# Patient Record
Sex: Male | Born: 1965 | Race: White | Hispanic: No | Marital: Single | Smoking: Current some day smoker
Health system: Southern US, Community
[De-identification: ages and names within clinical notes are randomized; demographics above are authoritative.]

## PROBLEM LIST (undated history)

## (undated) DIAGNOSIS — Z9889 Other specified postprocedural states: Secondary | ICD-10-CM

## (undated) DIAGNOSIS — E119 Type 2 diabetes mellitus without complications: Secondary | ICD-10-CM

## (undated) DIAGNOSIS — G8929 Other chronic pain: Secondary | ICD-10-CM

## (undated) DIAGNOSIS — M79671 Pain in right foot: Secondary | ICD-10-CM

## (undated) HISTORY — DX: Pain in right foot: M79.671

## (undated) HISTORY — PX: LEG SURGERY: SHX1003

## (undated) HISTORY — DX: Other chronic pain: G89.29

## (undated) HISTORY — DX: Type 2 diabetes mellitus without complications: E11.9

## (undated) HISTORY — PX: ORTHOPEDIC SURGERY: SHX850

---

## 2004-03-25 ENCOUNTER — Emergency Department (HOSPITAL_COMMUNITY): Admission: EM | Admit: 2004-03-25 | Discharge: 2004-03-25 | Payer: Self-pay | Admitting: Emergency Medicine

## 2009-12-14 ENCOUNTER — Emergency Department (HOSPITAL_COMMUNITY): Admission: EM | Admit: 2009-12-14 | Discharge: 2009-12-14 | Payer: Self-pay | Admitting: Emergency Medicine

## 2010-01-04 ENCOUNTER — Emergency Department (HOSPITAL_COMMUNITY): Admission: EM | Admit: 2010-01-04 | Discharge: 2010-01-04 | Payer: Self-pay | Admitting: Emergency Medicine

## 2010-01-23 ENCOUNTER — Emergency Department (HOSPITAL_COMMUNITY): Admission: EM | Admit: 2010-01-23 | Discharge: 2010-01-23 | Payer: Self-pay | Admitting: Emergency Medicine

## 2010-02-15 ENCOUNTER — Observation Stay (HOSPITAL_COMMUNITY): Admission: EM | Admit: 2010-02-15 | Discharge: 2010-02-15 | Payer: Self-pay | Admitting: Emergency Medicine

## 2010-03-08 ENCOUNTER — Emergency Department (HOSPITAL_COMMUNITY): Admission: EM | Admit: 2010-03-08 | Discharge: 2010-03-08 | Payer: Self-pay | Admitting: Emergency Medicine

## 2010-03-17 ENCOUNTER — Ambulatory Visit: Payer: Self-pay | Admitting: Cardiology

## 2010-03-17 ENCOUNTER — Inpatient Hospital Stay (HOSPITAL_COMMUNITY): Admission: AC | Admit: 2010-03-17 | Discharge: 2010-04-02 | Payer: Self-pay

## 2010-03-31 ENCOUNTER — Encounter (INDEPENDENT_AMBULATORY_CARE_PROVIDER_SITE_OTHER): Payer: Self-pay | Admitting: Orthopedic Surgery

## 2010-05-12 ENCOUNTER — Emergency Department (HOSPITAL_COMMUNITY): Admission: EM | Admit: 2010-05-12 | Discharge: 2010-05-12 | Payer: Self-pay | Admitting: Emergency Medicine

## 2010-05-14 ENCOUNTER — Emergency Department (HOSPITAL_COMMUNITY): Admission: EM | Admit: 2010-05-14 | Discharge: 2010-05-14 | Payer: Self-pay | Admitting: Emergency Medicine

## 2010-05-26 ENCOUNTER — Emergency Department (HOSPITAL_COMMUNITY)
Admission: EM | Admit: 2010-05-26 | Discharge: 2010-05-26 | Payer: Self-pay | Source: Home / Self Care | Admitting: Emergency Medicine

## 2010-05-28 ENCOUNTER — Encounter (INDEPENDENT_AMBULATORY_CARE_PROVIDER_SITE_OTHER): Payer: Self-pay | Admitting: Internal Medicine

## 2010-05-28 ENCOUNTER — Ambulatory Visit: Payer: Self-pay | Admitting: Family Medicine

## 2010-05-28 LAB — CONVERTED CEMR LAB
BUN: 10 mg/dL (ref 6–23)
Chloride: 98 meq/L (ref 96–112)
Glucose, Bld: 65 mg/dL — ABNORMAL LOW (ref 70–99)

## 2010-06-03 ENCOUNTER — Emergency Department (HOSPITAL_COMMUNITY): Admission: EM | Admit: 2010-06-03 | Discharge: 2010-06-03 | Payer: Self-pay | Admitting: Emergency Medicine

## 2010-06-10 ENCOUNTER — Emergency Department (HOSPITAL_COMMUNITY)
Admission: EM | Admit: 2010-06-10 | Discharge: 2010-06-11 | Payer: Self-pay | Source: Home / Self Care | Admitting: Emergency Medicine

## 2010-06-11 ENCOUNTER — Ambulatory Visit: Payer: Self-pay | Admitting: Internal Medicine

## 2010-06-22 ENCOUNTER — Encounter: Admission: RE | Admit: 2010-06-22 | Discharge: 2010-07-12 | Payer: Self-pay | Admitting: Orthopedic Surgery

## 2010-07-01 ENCOUNTER — Emergency Department (HOSPITAL_COMMUNITY)
Admission: EM | Admit: 2010-07-01 | Discharge: 2010-07-01 | Payer: Self-pay | Source: Home / Self Care | Admitting: Emergency Medicine

## 2011-01-02 LAB — GLUCOSE, CAPILLARY
Glucose-Capillary: 287 mg/dL — ABNORMAL HIGH (ref 70–99)
Glucose-Capillary: 400 mg/dL — ABNORMAL HIGH (ref 70–99)
Glucose-Capillary: 66 mg/dL — ABNORMAL LOW (ref 70–99)
Glucose-Capillary: 75 mg/dL (ref 70–99)
Glucose-Capillary: 83 mg/dL (ref 70–99)

## 2011-01-02 LAB — BASIC METABOLIC PANEL
BUN: 11 mg/dL (ref 6–23)
Calcium: 7.7 mg/dL — ABNORMAL LOW (ref 8.4–10.5)
Creatinine, Ser: 1.03 mg/dL (ref 0.4–1.5)
Creatinine, Ser: 1.45 mg/dL (ref 0.4–1.5)
GFR calc Af Amer: >60 mL/min (ref >60)
GFR calc Af Amer: >60 mL/min (ref >60)
GFR calc non Af Amer: 53 mL/min — ABNORMAL LOW (ref >60)
GFR calc non Af Amer: >60 mL/min (ref >60)
GFR calc non Af Amer: >60 mL/min (ref >60)
Glucose, Bld: 379 mg/dL — ABNORMAL HIGH (ref 70–99)
Potassium: 3.7 mEq/L (ref 3.5–5.1)
Potassium: 4.9 mEq/L (ref 3.5–5.1)
Sodium: 131 mEq/L — ABNORMAL LOW (ref 135–145)

## 2011-01-02 LAB — CBC
HCT: 23.3 % — ABNORMAL LOW (ref 39.0–52.0)
HCT: 24.1 % — ABNORMAL LOW (ref 39.0–52.0)
Hemoglobin: 7.9 g/dL — ABNORMAL LOW (ref 13.0–17.0)
MCHC: 34 g/dL (ref 30.0–36.0)
MCV: 92.2 fL (ref 78.0–100.0)
MCV: 93.2 fL (ref 78.0–100.0)
Platelets: 531 10*3/uL — ABNORMAL HIGH (ref 150–400)
RBC: 2.35 MIL/uL — ABNORMAL LOW (ref 4.22–5.81)
RBC: 2.59 MIL/uL — ABNORMAL LOW (ref 4.22–5.81)
RDW: 15.5 % (ref 11.5–15.5)
WBC: 16 10*3/uL — ABNORMAL HIGH (ref 4.0–10.5)
WBC: 9.4 10*3/uL (ref 4.0–10.5)

## 2011-01-02 LAB — PROTIME-INR
INR: 1.32 (ref 0.00–1.49)
INR: 1.34 (ref 0.00–1.49)
INR: 1.36 (ref 0.00–1.49)
Prothrombin Time: 16.3 seconds — ABNORMAL HIGH (ref 11.6–15.2)
Prothrombin Time: 16.5 seconds — ABNORMAL HIGH (ref 11.6–15.2)
Prothrombin Time: 16.7 seconds — ABNORMAL HIGH (ref 11.6–15.2)

## 2011-01-02 LAB — TYPE AND SCREEN: ABO/RH(D): O POS

## 2011-01-03 LAB — GLUCOSE, CAPILLARY
Glucose-Capillary: 119 mg/dL — ABNORMAL HIGH (ref 70–99)
Glucose-Capillary: 128 mg/dL — ABNORMAL HIGH (ref 70–99)
Glucose-Capillary: 129 mg/dL — ABNORMAL HIGH (ref 70–99)
Glucose-Capillary: 136 mg/dL — ABNORMAL HIGH (ref 70–99)
Glucose-Capillary: 144 mg/dL — ABNORMAL HIGH (ref 70–99)
Glucose-Capillary: 150 mg/dL — ABNORMAL HIGH (ref 70–99)
Glucose-Capillary: 152 mg/dL — ABNORMAL HIGH (ref 70–99)
Glucose-Capillary: 156 mg/dL — ABNORMAL HIGH (ref 70–99)
Glucose-Capillary: 157 mg/dL — ABNORMAL HIGH (ref 70–99)
Glucose-Capillary: 159 mg/dL — ABNORMAL HIGH (ref 70–99)
Glucose-Capillary: 162 mg/dL — ABNORMAL HIGH (ref 70–99)
Glucose-Capillary: 171 mg/dL — ABNORMAL HIGH (ref 70–99)
Glucose-Capillary: 173 mg/dL — ABNORMAL HIGH (ref 70–99)
Glucose-Capillary: 174 mg/dL — ABNORMAL HIGH (ref 70–99)
Glucose-Capillary: 186 mg/dL — ABNORMAL HIGH (ref 70–99)
Glucose-Capillary: 191 mg/dL — ABNORMAL HIGH (ref 70–99)
Glucose-Capillary: 192 mg/dL — ABNORMAL HIGH (ref 70–99)
Glucose-Capillary: 204 mg/dL — ABNORMAL HIGH (ref 70–99)
Glucose-Capillary: 211 mg/dL — ABNORMAL HIGH (ref 70–99)
Glucose-Capillary: 220 mg/dL — ABNORMAL HIGH (ref 70–99)
Glucose-Capillary: 240 mg/dL — ABNORMAL HIGH (ref 70–99)
Glucose-Capillary: 241 mg/dL — ABNORMAL HIGH (ref 70–99)
Glucose-Capillary: 249 mg/dL — ABNORMAL HIGH (ref 70–99)
Glucose-Capillary: 270 mg/dL — ABNORMAL HIGH (ref 70–99)
Glucose-Capillary: 277 mg/dL — ABNORMAL HIGH (ref 70–99)
Glucose-Capillary: 294 mg/dL — ABNORMAL HIGH (ref 70–99)
Glucose-Capillary: 299 mg/dL — ABNORMAL HIGH (ref 70–99)
Glucose-Capillary: 308 mg/dL — ABNORMAL HIGH (ref 70–99)
Glucose-Capillary: 308 mg/dL — ABNORMAL HIGH (ref 70–99)
Glucose-Capillary: 308 mg/dL — ABNORMAL HIGH (ref 70–99)
Glucose-Capillary: 369 mg/dL — ABNORMAL HIGH (ref 70–99)
Glucose-Capillary: 50 mg/dL — ABNORMAL LOW (ref 70–99)
Glucose-Capillary: 67 mg/dL — ABNORMAL LOW (ref 70–99)
Glucose-Capillary: 82 mg/dL (ref 70–99)
Glucose-Capillary: 85 mg/dL (ref 70–99)

## 2011-01-03 LAB — CBC
HCT: 21 % — ABNORMAL LOW (ref 39.0–52.0)
HCT: 21.2 % — ABNORMAL LOW (ref 39.0–52.0)
HCT: 21.4 % — ABNORMAL LOW (ref 39.0–52.0)
HCT: 21.9 % — ABNORMAL LOW (ref 39.0–52.0)
HCT: 22.1 % — ABNORMAL LOW (ref 39.0–52.0)
HCT: 22.2 % — ABNORMAL LOW (ref 39.0–52.0)
HCT: 24.3 % — ABNORMAL LOW (ref 39.0–52.0)
HCT: 25.1 % — ABNORMAL LOW (ref 39.0–52.0)
HCT: 29 % — ABNORMAL LOW (ref 39.0–52.0)
HCT: 30 % — ABNORMAL LOW (ref 39.0–52.0)
HCT: 35.4 % — ABNORMAL LOW (ref 39.0–52.0)
HCT: 48.4 % (ref 39.0–52.0)
Hemoglobin: 10.2 g/dL — ABNORMAL LOW (ref 13.0–17.0)
Hemoglobin: 12.3 g/dL — ABNORMAL LOW (ref 13.0–17.0)
Hemoglobin: 16.8 g/dL (ref 13.0–17.0)
Hemoglobin: 7.2 g/dL — ABNORMAL LOW (ref 13.0–17.0)
Hemoglobin: 7.3 g/dL — ABNORMAL LOW (ref 13.0–17.0)
Hemoglobin: 7.3 g/dL — ABNORMAL LOW (ref 13.0–17.0)
Hemoglobin: 7.4 g/dL — ABNORMAL LOW (ref 13.0–17.0)
Hemoglobin: 7.5 g/dL — ABNORMAL LOW (ref 13.0–17.0)
Hemoglobin: 7.7 g/dL — ABNORMAL LOW (ref 13.0–17.0)
Hemoglobin: 7.8 g/dL — ABNORMAL LOW (ref 13.0–17.0)
Hemoglobin: 8.5 g/dL — ABNORMAL LOW (ref 13.0–17.0)
MCHC: 34.3 g/dL (ref 30.0–36.0)
MCHC: 34.6 g/dL (ref 30.0–36.0)
MCHC: 34.7 g/dL (ref 30.0–36.0)
MCHC: 35.1 g/dL (ref 30.0–36.0)
MCHC: 35.2 g/dL (ref 30.0–36.0)
MCHC: 35.4 g/dL (ref 30.0–36.0)
MCHC: 35.5 g/dL (ref 30.0–36.0)
MCHC: 35.9 g/dL (ref 30.0–36.0)
MCV: 91.9 fL (ref 78.0–100.0)
MCV: 92.3 fL (ref 78.0–100.0)
MCV: 93.2 fL (ref 78.0–100.0)
MCV: 93.4 fL (ref 78.0–100.0)
MCV: 93.5 fL (ref 78.0–100.0)
MCV: 93.6 fL (ref 78.0–100.0)
MCV: 93.7 fL (ref 78.0–100.0)
MCV: 93.7 fL (ref 78.0–100.0)
MCV: 94.5 fL (ref 78.0–100.0)
MCV: 97.2 fL (ref 78.0–100.0)
Platelets: 100 10*3/uL — ABNORMAL LOW (ref 150–400)
Platelets: 129 10*3/uL — ABNORMAL LOW (ref 150–400)
Platelets: 161 10*3/uL (ref 150–400)
Platelets: 229 10*3/uL (ref 150–400)
Platelets: 337 10*3/uL (ref 150–400)
Platelets: 346 10*3/uL (ref 150–400)
Platelets: 556 10*3/uL — ABNORMAL HIGH (ref 150–400)
Platelets: 599 10*3/uL — ABNORMAL HIGH (ref 150–400)
Platelets: 646 10*3/uL — ABNORMAL HIGH (ref 150–400)
Platelets: 697 10*3/uL — ABNORMAL HIGH (ref 150–400)
Platelets: 95 10*3/uL — ABNORMAL LOW (ref 150–400)
RBC: 2.17 MIL/uL — ABNORMAL LOW (ref 4.22–5.81)
RBC: 2.25 MIL/uL — ABNORMAL LOW (ref 4.22–5.81)
RBC: 2.26 MIL/uL — ABNORMAL LOW (ref 4.22–5.81)
RBC: 2.29 MIL/uL — ABNORMAL LOW (ref 4.22–5.81)
RBC: 2.37 MIL/uL — ABNORMAL LOW (ref 4.22–5.81)
RBC: 2.69 MIL/uL — ABNORMAL LOW (ref 4.22–5.81)
RBC: 2.72 MIL/uL — ABNORMAL LOW (ref 4.22–5.81)
RBC: 2.83 MIL/uL — ABNORMAL LOW (ref 4.22–5.81)
RBC: 4.98 MIL/uL (ref 4.22–5.81)
RDW: 14.2 % (ref 11.5–15.5)
RDW: 14.2 % (ref 11.5–15.5)
RDW: 14.2 % (ref 11.5–15.5)
RDW: 14.4 % (ref 11.5–15.5)
RDW: 14.6 % (ref 11.5–15.5)
RDW: 14.7 % (ref 11.5–15.5)
RDW: 14.9 % (ref 11.5–15.5)
RDW: 15.2 % (ref 11.5–15.5)
RDW: 15.2 % (ref 11.5–15.5)
RDW: 15.5 % (ref 11.5–15.5)
RDW: 15.9 % — ABNORMAL HIGH (ref 11.5–15.5)
WBC: 10 10*3/uL (ref 4.0–10.5)
WBC: 10.4 10*3/uL (ref 4.0–10.5)
WBC: 6.6 10*3/uL (ref 4.0–10.5)
WBC: 7.3 10*3/uL (ref 4.0–10.5)
WBC: 8.1 10*3/uL (ref 4.0–10.5)
WBC: 8.7 10*3/uL (ref 4.0–10.5)
WBC: 8.9 10*3/uL (ref 4.0–10.5)

## 2011-01-03 LAB — PROTIME-INR
INR: 1.1 (ref 0.00–1.49)
INR: 1.21 (ref 0.00–1.49)
INR: 1.3 (ref 0.00–1.49)
INR: 1.39 (ref 0.00–1.49)
INR: 1.5 — ABNORMAL HIGH (ref 0.00–1.49)
INR: 1.69 — ABNORMAL HIGH (ref 0.00–1.49)
INR: 1.73 — ABNORMAL HIGH (ref 0.00–1.49)
Prothrombin Time: 15.2 seconds (ref 11.6–15.2)
Prothrombin Time: 16.1 seconds — ABNORMAL HIGH (ref 11.6–15.2)
Prothrombin Time: 16.8 seconds — ABNORMAL HIGH (ref 11.6–15.2)
Prothrombin Time: 16.9 seconds — ABNORMAL HIGH (ref 11.6–15.2)
Prothrombin Time: 18 seconds — ABNORMAL HIGH (ref 11.6–15.2)
Prothrombin Time: 18.1 seconds — ABNORMAL HIGH (ref 11.6–15.2)
Prothrombin Time: 21.8 seconds — ABNORMAL HIGH (ref 11.6–15.2)
Prothrombin Time: 24.7 seconds — ABNORMAL HIGH (ref 11.6–15.2)
Prothrombin Time: 33.7 seconds — ABNORMAL HIGH (ref 11.6–15.2)

## 2011-01-03 LAB — TYPE AND SCREEN

## 2011-01-03 LAB — POCT I-STAT 7, (LYTES, BLD GAS, ICA,H+H)
Acid-base deficit: 4 mmol/L — ABNORMAL HIGH (ref 0.0–2.0)
Acid-base deficit: 5 mmol/L — ABNORMAL HIGH (ref 0.0–2.0)
Acid-base deficit: 6 mmol/L — ABNORMAL HIGH (ref 0.0–2.0)
Bicarbonate: 19.5 meq/L — ABNORMAL LOW (ref 20.0–24.0)
Bicarbonate: 22.3 meq/L (ref 20.0–24.0)
Calcium, Ion: 1.04 mmol/L — ABNORMAL LOW (ref 1.12–1.32)
Calcium, Ion: 1.05 mmol/L — ABNORMAL LOW (ref 1.12–1.32)
HCT: 27 % — ABNORMAL LOW (ref 39.0–52.0)
HCT: 30 % — ABNORMAL LOW (ref 39.0–52.0)
HCT: 39 % (ref 39.0–52.0)
Hemoglobin: 10.2 g/dL — ABNORMAL LOW (ref 13.0–17.0)
Hemoglobin: 13.3 g/dL (ref 13.0–17.0)
Hemoglobin: 9.2 g/dL — ABNORMAL LOW (ref 13.0–17.0)
O2 Saturation: 100 %
Potassium: 3.6 meq/L (ref 3.5–5.1)
Sodium: 143 meq/L (ref 135–145)
TCO2: 21 mmol/L (ref 0–100)
TCO2: 24 mmol/L (ref 0–100)
pCO2 arterial: 49.1 mmHg — ABNORMAL HIGH (ref 35.0–45.0)
pH, Arterial: 7.197 — CL (ref 7.350–7.450)
pH, Arterial: 7.264 — ABNORMAL LOW (ref 7.350–7.450)
pO2, Arterial: 116 mmHg — ABNORMAL HIGH (ref 80.0–100.0)
pO2, Arterial: 147 mmHg — ABNORMAL HIGH (ref 80.0–100.0)
pO2, Arterial: 369 mmHg — ABNORMAL HIGH (ref 80.0–100.0)

## 2011-01-03 LAB — POCT I-STAT GLUCOSE
Glucose, Bld: 70 mg/dL (ref 70–99)
Glucose, Bld: 80 mg/dL (ref 70–99)
Glucose, Bld: 95 mg/dL (ref 70–99)
Operator id: 119881
Operator id: 178832

## 2011-01-03 LAB — BASIC METABOLIC PANEL
BUN: 13 mg/dL (ref 6–23)
BUN: 5 mg/dL — ABNORMAL LOW (ref 6–23)
CO2: 26 mEq/L (ref 19–32)
CO2: 26 mEq/L (ref 19–32)
CO2: 29 mEq/L (ref 19–32)
Calcium: 7.6 mg/dL — ABNORMAL LOW (ref 8.4–10.5)
Calcium: 8.1 mg/dL — ABNORMAL LOW (ref 8.4–10.5)
Calcium: 8.4 mg/dL (ref 8.4–10.5)
Chloride: 103 mEq/L (ref 96–112)
Chloride: 107 mEq/L (ref 96–112)
Chloride: 96 mEq/L (ref 96–112)
Chloride: 97 mEq/L (ref 96–112)
Creatinine, Ser: 1.12 mg/dL (ref 0.4–1.5)
Creatinine, Ser: 1.14 mg/dL (ref 0.4–1.5)
Creatinine, Ser: 1.19 mg/dL (ref 0.4–1.5)
GFR calc Af Amer: >60 mL/min (ref >60)
GFR calc Af Amer: >60 mL/min (ref >60)
GFR calc Af Amer: >60 mL/min (ref >60)
GFR calc Af Amer: >60 mL/min (ref >60)
GFR calc non Af Amer: >60 mL/min (ref >60)
GFR calc non Af Amer: >60 mL/min (ref >60)
Glucose, Bld: 100 mg/dL — ABNORMAL HIGH (ref 70–99)
Glucose, Bld: 180 mg/dL — ABNORMAL HIGH (ref 70–99)
Potassium: 3.8 mEq/L (ref 3.5–5.1)
Potassium: 4.6 mEq/L (ref 3.5–5.1)
Sodium: 136 mEq/L (ref 135–145)

## 2011-01-03 LAB — COMPREHENSIVE METABOLIC PANEL
Albumin: 3.4 g/dL — ABNORMAL LOW (ref 3.5–5.2)
Alkaline Phosphatase: 25 U/L — ABNORMAL LOW (ref 39–117)
Alkaline Phosphatase: 49 U/L (ref 39–117)
BUN: 11 mg/dL (ref 6–23)
BUN: 6 mg/dL (ref 6–23)
CO2: 18 mEq/L — ABNORMAL LOW (ref 19–32)
Calcium: 6.7 mg/dL — ABNORMAL LOW (ref 8.4–10.5)
Chloride: 99 mEq/L (ref 96–112)
Creatinine, Ser: 1.71 mg/dL — ABNORMAL HIGH (ref 0.4–1.5)
GFR calc non Af Amer: 44 mL/min — ABNORMAL LOW (ref >60)
Glucose, Bld: 148 mg/dL — ABNORMAL HIGH (ref 70–99)
Glucose, Bld: 261 mg/dL — ABNORMAL HIGH (ref 70–99)
Potassium: 3.6 mEq/L (ref 3.5–5.1)
Potassium: 3.8 mEq/L (ref 3.5–5.1)
Total Bilirubin: 0.8 mg/dL (ref 0.3–1.2)
Total Protein: 3.4 g/dL — ABNORMAL LOW (ref 6.0–8.3)

## 2011-01-03 LAB — POCT I-STAT, CHEM 8
BUN: 12 mg/dL (ref 6–23)
Calcium, Ion: 0.95 mmol/L — ABNORMAL LOW (ref 1.12–1.32)
Chloride: 101 meq/L (ref 96–112)
Creatinine, Ser: 1.8 mg/dL — ABNORMAL HIGH (ref 0.4–1.5)
TCO2: 18 mmol/L (ref 0–100)

## 2011-01-03 LAB — APTT: aPTT: 27 seconds (ref 24–37)

## 2011-01-03 LAB — POCT I-STAT 3, ART BLOOD GAS (G3+)
Acid-Base Excess: 2 mmol/L (ref 0.0–2.0)
Bicarbonate: 26.9 meq/L — ABNORMAL HIGH (ref 20.0–24.0)
O2 Saturation: 92 %
Patient temperature: 99.1
TCO2: 28 mmol/L (ref 0–100)

## 2011-01-03 LAB — PREPARE FRESH FROZEN PLASMA

## 2011-01-03 LAB — CULTURE, BLOOD (ROUTINE X 2): Culture: NO GROWTH

## 2011-01-03 LAB — PREPARE PLATELETS

## 2011-01-03 LAB — CROSSMATCH: ABO/RH(D): O POS

## 2011-01-03 LAB — POCT I-STAT 4, (NA,K, GLUC, HGB,HCT)
HCT: 30 % — ABNORMAL LOW (ref 39.0–52.0)
Hemoglobin: 10.2 g/dL — ABNORMAL LOW (ref 13.0–17.0)
Potassium: 3.3 meq/L — ABNORMAL LOW (ref 3.5–5.1)
Sodium: 144 meq/L (ref 135–145)

## 2011-01-03 LAB — MRSA PCR SCREENING: MRSA by PCR: NEGATIVE

## 2011-01-03 LAB — URINE CULTURE: Culture: NO GROWTH

## 2011-01-03 LAB — DIFFERENTIAL
Basophils Absolute: 0 10*3/uL (ref 0.0–0.1)
Eosinophils Absolute: 0 10*3/uL (ref 0.0–0.7)
Eosinophils Relative: 0 % (ref 0–5)

## 2011-01-03 LAB — ABO/RH: ABO/RH(D): O POS

## 2011-01-04 LAB — POCT I-STAT, CHEM 8
Calcium, Ion: 1.11 mmol/L — ABNORMAL LOW (ref 1.12–1.32)
Chloride: 103 mEq/L (ref 96–112)
Chloride: 106 mEq/L (ref 96–112)
Glucose, Bld: 194 mg/dL — ABNORMAL HIGH (ref 70–99)
HCT: 47 % (ref 39.0–52.0)
Hemoglobin: 17.7 g/dL — ABNORMAL HIGH (ref 13.0–17.0)
Potassium: 4.2 mEq/L (ref 3.5–5.1)
Potassium: 5.6 mEq/L — ABNORMAL HIGH (ref 3.5–5.1)

## 2011-01-04 LAB — DIFFERENTIAL
Basophils Absolute: 0 10*3/uL (ref 0.0–0.1)
Basophils Relative: 0 % (ref 0–1)
Eosinophils Absolute: 0.1 10*3/uL (ref 0.0–0.7)
Monocytes Absolute: 0.3 10*3/uL (ref 0.1–1.0)
Neutro Abs: 5.8 10*3/uL (ref 1.7–7.7)
Neutrophils Relative %: 79 % — ABNORMAL HIGH (ref 43–77)

## 2011-01-04 LAB — CBC
HCT: 48.2 % (ref 39.0–52.0)
Platelets: 183 10*3/uL (ref 150–400)
RDW: 13.6 % (ref 11.5–15.5)
WBC: 7.3 10*3/uL (ref 4.0–10.5)

## 2011-01-04 LAB — URINALYSIS, ROUTINE W REFLEX MICROSCOPIC
Bilirubin Urine: NEGATIVE
Leukocytes, UA: NEGATIVE
Nitrite: NEGATIVE
Specific Gravity, Urine: 1.029 (ref 1.005–1.030)
Urobilinogen, UA: 0.2 mg/dL (ref 0.0–1.0)
pH: 5.5 (ref 5.0–8.0)

## 2011-01-04 LAB — GLUCOSE, CAPILLARY

## 2011-01-04 LAB — URINE MICROSCOPIC-ADD ON

## 2011-01-05 LAB — GLUCOSE, CAPILLARY: Glucose-Capillary: 146 mg/dL — ABNORMAL HIGH (ref 70–99)

## 2011-01-06 LAB — URINALYSIS, ROUTINE W REFLEX MICROSCOPIC
Bilirubin Urine: NEGATIVE
Glucose, UA: >1000 mg/dL — AB
Hgb urine dipstick: NEGATIVE
Ketones, ur: NEGATIVE mg/dL
Leukocytes, UA: NEGATIVE
Nitrite: NEGATIVE
Protein, ur: NEGATIVE mg/dL
Specific Gravity, Urine: 1.031 — ABNORMAL HIGH (ref 1.005–1.030)
Urobilinogen, UA: 1 mg/dL (ref 0.0–1.0)
pH: 5.5 (ref 5.0–8.0)

## 2011-01-06 LAB — URINE MICROSCOPIC-ADD ON

## 2011-01-06 LAB — POCT I-STAT, CHEM 8
BUN: 10 mg/dL (ref 6–23)
Calcium, Ion: 1.03 mmol/L — ABNORMAL LOW (ref 1.12–1.32)
Chloride: 103 meq/L (ref 96–112)
Creatinine, Ser: 1.1 mg/dL (ref 0.4–1.5)
Glucose, Bld: 320 mg/dL — ABNORMAL HIGH (ref 70–99)
HCT: 47 % (ref 39.0–52.0)
Hemoglobin: 16 g/dL (ref 13.0–17.0)
Potassium: 4.4 meq/L (ref 3.5–5.1)
Sodium: 137 meq/L (ref 135–145)
TCO2: 26 mmol/L (ref 0–100)

## 2011-01-06 LAB — GLUCOSE, CAPILLARY: Glucose-Capillary: 270 mg/dL — ABNORMAL HIGH (ref 70–99)
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
## 2011-07-26 ENCOUNTER — Emergency Department (HOSPITAL_COMMUNITY)
Admission: EM | Admit: 2011-07-26 | Discharge: 2011-07-26 | Disposition: A | Discharge status: Home / Self Care | Payer: Medicaid Other | Attending: Emergency Medicine | Admitting: Emergency Medicine

## 2011-07-26 ENCOUNTER — Emergency Department (HOSPITAL_COMMUNITY): Payer: Medicaid Other

## 2011-07-26 DIAGNOSIS — Z794 Long term (current) use of insulin: Secondary | ICD-10-CM | POA: Insufficient documentation

## 2011-07-26 DIAGNOSIS — W07XXXA Fall from chair, initial encounter: Secondary | ICD-10-CM | POA: Insufficient documentation

## 2011-07-26 DIAGNOSIS — M25469 Effusion, unspecified knee: Secondary | ICD-10-CM | POA: Insufficient documentation

## 2011-07-26 DIAGNOSIS — M25569 Pain in unspecified knee: Secondary | ICD-10-CM | POA: Insufficient documentation

## 2011-07-26 DIAGNOSIS — S83106A Unspecified dislocation of unspecified knee, initial encounter: Secondary | ICD-10-CM | POA: Insufficient documentation

## 2011-07-26 DIAGNOSIS — E119 Type 2 diabetes mellitus without complications: Secondary | ICD-10-CM | POA: Insufficient documentation

## 2011-07-26 DIAGNOSIS — Y9229 Other specified public building as the place of occurrence of the external cause: Secondary | ICD-10-CM | POA: Insufficient documentation

## 2011-08-17 ENCOUNTER — Ambulatory Visit
Admission: RE | Admit: 2011-08-17 | Discharge: 2011-08-17 | Disposition: A | Discharge status: Home / Self Care | Payer: Medicaid Other | Source: Ambulatory Visit | Attending: Orthopedic Surgery | Admitting: Orthopedic Surgery

## 2011-08-17 ENCOUNTER — Other Ambulatory Visit: Payer: Self-pay | Admitting: Orthopedic Surgery

## 2011-08-17 DIAGNOSIS — T148XXA Other injury of unspecified body region, initial encounter: Secondary | ICD-10-CM

## 2014-06-10 ENCOUNTER — Ambulatory Visit (INDEPENDENT_AMBULATORY_CARE_PROVIDER_SITE_OTHER): Payer: Medicaid Other | Admitting: General Surgery

## 2015-12-10 ENCOUNTER — Encounter: Payer: Self-pay | Admitting: Family Medicine

## 2015-12-10 ENCOUNTER — Ambulatory Visit (INDEPENDENT_AMBULATORY_CARE_PROVIDER_SITE_OTHER): Payer: Self-pay | Admitting: Family Medicine

## 2015-12-10 ENCOUNTER — Other Ambulatory Visit: Payer: Self-pay | Admitting: Family Medicine

## 2015-12-10 VITALS — BP 111/67 | HR 101 | Temp 98.1°F | Resp 16 | Ht 71.0 in | Wt 200.0 lb

## 2015-12-10 DIAGNOSIS — Z794 Long term (current) use of insulin: Secondary | ICD-10-CM

## 2015-12-10 DIAGNOSIS — E1142 Type 2 diabetes mellitus with diabetic polyneuropathy: Secondary | ICD-10-CM

## 2015-12-10 DIAGNOSIS — G629 Polyneuropathy, unspecified: Secondary | ICD-10-CM

## 2015-12-10 DIAGNOSIS — G8929 Other chronic pain: Secondary | ICD-10-CM

## 2015-12-10 DIAGNOSIS — M25571 Pain in right ankle and joints of right foot: Secondary | ICD-10-CM

## 2015-12-10 DIAGNOSIS — M79606 Pain in leg, unspecified: Secondary | ICD-10-CM

## 2015-12-10 DIAGNOSIS — M79602 Pain in left arm: Secondary | ICD-10-CM

## 2015-12-10 DIAGNOSIS — F17200 Nicotine dependence, unspecified, uncomplicated: Secondary | ICD-10-CM

## 2015-12-10 DIAGNOSIS — F32A Depression, unspecified: Secondary | ICD-10-CM

## 2015-12-10 DIAGNOSIS — F329 Major depressive disorder, single episode, unspecified: Secondary | ICD-10-CM

## 2015-12-10 DIAGNOSIS — Z23 Encounter for immunization: Secondary | ICD-10-CM

## 2015-12-10 DIAGNOSIS — M79605 Pain in left leg: Secondary | ICD-10-CM

## 2015-12-10 LAB — COMPLETE METABOLIC PANEL WITH GFR
ALT: 19 U/L (ref 9–46)
AST: 23 U/L (ref 10–35)
Albumin: 4.1 g/dL (ref 3.6–5.1)
Alkaline Phosphatase: 60 U/L (ref 40–115)
BILIRUBIN TOTAL: 0.4 mg/dL (ref 0.2–1.2)
BUN: 12 mg/dL (ref 7–25)
CALCIUM: 8.9 mg/dL (ref 8.6–10.3)
CHLORIDE: 103 mmol/L (ref 98–110)
CO2: 20 mmol/L (ref 20–31)
Creat: 1.06 mg/dL (ref 0.70–1.33)
GFR, EST NON AFRICAN AMERICAN: 81 mL/min (ref >=60)
Glucose, Bld: 282 mg/dL — ABNORMAL HIGH (ref 65–99)
Potassium: 4.5 mmol/L (ref 3.5–5.3)
Sodium: 138 mmol/L (ref 135–146)
TOTAL PROTEIN: 6.7 g/dL (ref 6.1–8.1)

## 2015-12-10 LAB — POCT URINALYSIS DIP (DEVICE)
Bilirubin Urine: NEGATIVE
Hgb urine dipstick: NEGATIVE
KETONES UR: NEGATIVE mg/dL
Leukocytes, UA: NEGATIVE
Nitrite: NEGATIVE
PH: 5.5 (ref 5.0–8.0)
PROTEIN: NEGATIVE mg/dL
Specific Gravity, Urine: 1.015 (ref 1.005–1.030)
UROBILINOGEN UA: 0.2 mg/dL (ref 0.0–1.0)

## 2015-12-10 LAB — CBC WITH DIFFERENTIAL/PLATELET
BASOS PCT: 0 % (ref 0–1)
Basophils Absolute: 0 10*3/uL (ref 0.0–0.1)
Eosinophils Absolute: 0.1 10*3/uL (ref 0.0–0.7)
Eosinophils Relative: 1 % (ref 0–5)
HEMATOCRIT: 49.3 % (ref 39.0–52.0)
HEMOGLOBIN: 16.9 g/dL (ref 13.0–17.0)
LYMPHS ABS: 2 10*3/uL (ref 0.7–4.0)
Lymphocytes Relative: 30 % (ref 12–46)
MCH: 32.8 pg (ref 26.0–34.0)
MCHC: 34.3 g/dL (ref 30.0–36.0)
MCV: 95.5 fL (ref 78.0–100.0)
MONO ABS: 0.5 10*3/uL (ref 0.1–1.0)
MONOS PCT: 7 % (ref 3–12)
MPV: 10.5 fL (ref 8.6–12.4)
NEUTROS ABS: 4 10*3/uL (ref 1.7–7.7)
Neutrophils Relative %: 62 % (ref 43–77)
Platelets: 275 10*3/uL (ref 150–400)
RBC: 5.16 MIL/uL (ref 4.22–5.81)
RDW: 13.6 % (ref 11.5–15.5)
WBC: 6.5 10*3/uL (ref 4.0–10.5)

## 2015-12-10 LAB — LIPID PANEL
CHOLESTEROL: 211 mg/dL — AB (ref 125–200)
HDL: 59 mg/dL (ref >=40)
LDL CALC: 120 mg/dL (ref <130)
TRIGLYCERIDES: 159 mg/dL — AB (ref <150)
Total CHOL/HDL Ratio: 3.6 Ratio (ref <=5.0)
VLDL: 32 mg/dL — ABNORMAL HIGH (ref <30)

## 2015-12-10 LAB — TSH: TSH: 1.1 m[IU]/L (ref 0.40–4.50)

## 2015-12-10 MED ORDER — GABAPENTIN 300 MG PO CAPS
300.0000 mg | ORAL_CAPSULE | Freq: Three times a day (TID) | ORAL | Status: AC
Start: 2015-12-10 — End: ?

## 2015-12-10 MED ORDER — KETOROLAC TROMETHAMINE 60 MG/2ML IM SOLN
60.0000 mg | Freq: Once | INTRAMUSCULAR | Status: AC
  Administered 2015-12-10: 60 mg via INTRAMUSCULAR

## 2015-12-10 NOTE — Patient Instructions (Addendum)
Will review laboratory results and follow up by phone with medication changes for type  2 diabetes.  Will send a referral to pain management.  Will start a 1 month trial of gabapentin 300 mg three times per day. Refrain from drinking, driving,or operating machinery.  Will review medications as they become available. Follow up with [facility] [address]  [place], [place] Walk in services M-F 8 am-3pm.   Chronic Pain Chronic pain can be defined as pain that is off and on and lasts for 3-6 months or longer. Many things cause chronic pain, which can make it difficult to make a diagnosis. There are many treatment options available for chronic pain. However, finding a treatment that works well for you may require trying various approaches until the right one is found. Many people benefit from a combination of two or more types of treatment to control their pain. SYMPTOMS  Chronic pain can occur anywhere in the body and can range from mild to very severe. Some types of chronic pain include:  Headache.  Low back pain.  Cancer pain.  Arthritis pain.  Neurogenic pain. This is pain resulting from damage to nerves. People with chronic pain may also have other symptoms such as:  Depression.  Anger.  Insomnia.  Anxiety. DIAGNOSIS  Your health care provider will help diagnose your condition over time. In many cases, the initial focus will be on excluding possible conditions that could be causing the pain. Depending on your symptoms, your health care provider may order tests to diagnose your condition. Some of these tests may include:   Blood tests.   CT scan.   MRI.   X-rays.   Ultrasounds.   Nerve conduction studies.  You may need to see a specialist.  TREATMENT  Finding treatment that works well may take time. You may be referred to a pain specialist. He or she may prescribe medicine or therapies, such as:   Mindful meditation or yoga.  Shots  (injections) of numbing or pain-relieving medicines into the spine or area of pain.  Local electrical stimulation.  Acupuncture.   Massage therapy.   Aroma, color, light, or sound therapy.   Biofeedback.   Working with a physical therapist to keep from getting stiff.   Regular, gentle exercise.   Cognitive or behavioral therapy.   Group support.  Sometimes, surgery may be recommended.  HOME CARE INSTRUCTIONS   Take all medicines as directed by your health care provider.   Lessen stress in your life by relaxing and doing things such as listening to calming music.   Exercise or be active as directed by your health care provider.   Eat a healthy diet and include things such as vegetables, fruits, fish, and lean meats in your diet.   Keep all follow-up appointments with your health care provider.   Attend a support group with others suffering from chronic pain. SEEK MEDICAL CARE IF:   Your pain gets worse.   You develop a new pain that was not there before.   You cannot tolerate medicines given to you by your health care provider.   You have new symptoms since your last visit with your health care provider.  SEEK IMMEDIATE MEDICAL CARE IF:   You feel weak.   You have decreased sensation or numbness.   You lose control of bowel or bladder function.   Your pain suddenly gets much worse.   You develop shaking.  You develop chills.  You develop confusion.  You develop chest pain.  You develop shortness of breath.  MAKE SURE YOU:  Understand these instructions.  Will watch your condition.  Will get help right away if you are not doing well or get worse.   This information is not intended to replace advice given to you by your health care provider. Make sure you discuss any questions you have with your health care provider.   Document Released: 06/25/2002 Document Revised: 06/05/2013 Document Reviewed: 03/29/2013 Elsevier Interactive  Patient Education [employer]
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
## 2015-12-11 ENCOUNTER — Encounter: Payer: Self-pay | Admitting: Family Medicine

## 2015-12-11 ENCOUNTER — Other Ambulatory Visit: Payer: Self-pay | Admitting: Family Medicine

## 2015-12-11 DIAGNOSIS — G629 Polyneuropathy, unspecified: Secondary | ICD-10-CM | POA: Insufficient documentation

## 2015-12-11 DIAGNOSIS — F17200 Nicotine dependence, unspecified, uncomplicated: Secondary | ICD-10-CM | POA: Insufficient documentation

## 2015-12-11 DIAGNOSIS — Z23 Encounter for immunization: Secondary | ICD-10-CM | POA: Insufficient documentation

## 2015-12-11 LAB — SEDIMENTATION RATE: Sed Rate: 1 mm/hr (ref 0–15)

## 2015-12-11 LAB — HEMOGLOBIN A1C
HEMOGLOBIN A1C: 7.9 % — AB (ref <5.7)
MEAN PLASMA GLUCOSE: 180 mg/dL — AB (ref <117)

## 2015-12-11 MED ORDER — LANCETS MISC: 1.0000 | Freq: Three times a day (TID) | Status: AC

## 2015-12-11 MED ORDER — TRUE METRIX AIR GLUCOSE METER W/DEVICE KIT: 1.0000 | PACK | Freq: Three times a day (TID) | Status: AC

## 2015-12-11 MED ORDER — GLUCOSE BLOOD VI STRP: ORAL_STRIP | Status: AC

## 2015-12-11 NOTE — Progress Notes (Unsigned)
Reviewed labs, hemoglobin A1C is 7.9 on current medication regimen. Will continue medication at current dosage

## 2015-12-14 ENCOUNTER — Telehealth: Payer: Self-pay

## 2015-12-14 NOTE — Telephone Encounter (Signed)
-----   Message from Massie Maroon, [place] sent at 12/11/2015 10:37 AM EST ----- Regarding: add on [facility] call [facility] to [facility] a sedimentation rate.  Also, please call patient to ask if he is consistenly taking NPH 25 units 3 times per day.    Thanks ----- Message -----    From: [facility] in [facility] Interface    Sent: 12/11/2015  12:47 AM      To: Massie Maroon, FNP

## 2015-12-14 NOTE — Telephone Encounter (Signed)
All numbers in chart are not in service. I will mail letter asking patient to contact our office. Thanks!

## 2015-12-30 ENCOUNTER — Telehealth: Payer: Self-pay

## 2015-12-30 NOTE — Telephone Encounter (Signed)
All phone numbers are saying not in service. Received a Call from Alejandro Phillips Pain management, his Insurance is showing not active. Office will not schedule until he contacts them and shows valid insurance or agrees to [employer] payment. Thanks!

## 2016-01-14 ENCOUNTER — Ambulatory Visit: Payer: Self-pay | Admitting: Family Medicine

## 2016-01-28 ENCOUNTER — Telehealth: Payer: Self-pay

## 2016-03-08 ENCOUNTER — Emergency Department (HOSPITAL_COMMUNITY)
Admission: EM | Admit: 2016-03-08 | Discharge: 2016-03-08 | Disposition: A | Discharge status: Home / Self Care | Payer: Medicaid Other | Attending: Emergency Medicine | Admitting: Emergency Medicine

## 2016-03-08 ENCOUNTER — Encounter (HOSPITAL_COMMUNITY): Payer: Self-pay | Admitting: Nurse Practitioner

## 2016-03-08 DIAGNOSIS — Z79899 Other long term (current) drug therapy: Secondary | ICD-10-CM | POA: Diagnosis not present

## 2016-03-08 DIAGNOSIS — Z794 Long term (current) use of insulin: Secondary | ICD-10-CM | POA: Diagnosis not present

## 2016-03-08 DIAGNOSIS — E119 Type 2 diabetes mellitus without complications: Secondary | ICD-10-CM | POA: Insufficient documentation

## 2016-03-08 DIAGNOSIS — T401X1A Poisoning by heroin, accidental (unintentional), initial encounter: Secondary | ICD-10-CM | POA: Diagnosis present

## 2016-03-08 DIAGNOSIS — T50901A Poisoning by unspecified drugs, medicaments and biological substances, accidental (unintentional), initial encounter: Secondary | ICD-10-CM

## 2016-03-08 DIAGNOSIS — F17210 Nicotine dependence, cigarettes, uncomplicated: Secondary | ICD-10-CM | POA: Insufficient documentation

## 2016-03-08 LAB — COMPREHENSIVE METABOLIC PANEL
ALK PHOS: 62 U/L (ref 38–126)
ALT: 32 U/L (ref 17–63)
ANION GAP: 6 (ref 5–15)
AST: 56 U/L — ABNORMAL HIGH (ref 15–41)
Albumin: 3.7 g/dL (ref 3.5–5.0)
BUN: 15 mg/dL (ref 6–20)
CALCIUM: 8.7 mg/dL — AB (ref 8.9–10.3)
CO2: 31 mmol/L (ref 22–32)
Chloride: 97 mmol/L — ABNORMAL LOW (ref 101–111)
Creatinine, Ser: 1.07 mg/dL (ref 0.61–1.24)
Glucose, Bld: 216 mg/dL — ABNORMAL HIGH (ref 65–99)
Potassium: 5 mmol/L (ref 3.5–5.1)
SODIUM: 134 mmol/L — AB (ref 135–145)
TOTAL PROTEIN: 6.6 g/dL (ref 6.5–8.1)
Total Bilirubin: 0.8 mg/dL (ref 0.3–1.2)

## 2016-03-08 LAB — CBC WITH DIFFERENTIAL/PLATELET
Basophils Absolute: 0 10*3/uL (ref 0.0–0.1)
Basophils Relative: 0 %
EOS ABS: 0.1 10*3/uL (ref 0.0–0.7)
EOS PCT: 1 %
HCT: 44.6 % (ref 39.0–52.0)
HEMOGLOBIN: 15.8 g/dL (ref 13.0–17.0)
LYMPHS ABS: 1 10*3/uL (ref 0.7–4.0)
Lymphocytes Relative: 12 %
MCH: 33.1 pg (ref 26.0–34.0)
MCHC: 35.4 g/dL (ref 30.0–36.0)
MCV: 93.3 fL (ref 78.0–100.0)
MONOS PCT: 12 %
Monocytes Absolute: 0.9 10*3/uL (ref 0.1–1.0)
NEUTROS PCT: 75 %
Neutro Abs: 6.2 10*3/uL (ref 1.7–7.7)
Platelets: 213 10*3/uL (ref 150–400)
RBC: 4.78 MIL/uL (ref 4.22–5.81)
RDW: 11.9 % (ref 11.5–15.5)
WBC: 8.2 10*3/uL (ref 4.0–10.5)

## 2016-03-08 LAB — RAPID URINE DRUG SCREEN, HOSP PERFORMED
Amphetamines: NOT DETECTED
BENZODIAZEPINES: NOT DETECTED
Barbiturates: NOT DETECTED
COCAINE: POSITIVE — AB
OPIATES: POSITIVE — AB
Tetrahydrocannabinol: NOT DETECTED

## 2016-03-08 LAB — ETHANOL: Alcohol, Ethyl (B): <5 mg/dL (ref <5)

## 2016-03-08 MED ORDER — NALOXONE HCL 2 MG/2ML IJ SOSY
2.0000 mg | PREFILLED_SYRINGE | Freq: Once | INTRAMUSCULAR | Status: AC
  Administered 2016-03-08: 2 mg via INTRAVENOUS
  Filled 2016-03-08: qty 2

## 2016-03-08 NOTE — Discharge Instructions (Signed)
Substance Abuse Treatment Programs ° °Intensive Outpatient Programs °[facility]     °[address]      °[place], [place]                   °[phone]      ° °[facility] °[address] °[place], [place] °[phone] ° °[facility]     °(Inpatient and outpatient)     °[address]           °[phone]   ° °[facility] °[phone] (Suboxone and Methadone) ° °[address]      °[place], [place] [address]      °[phone]      ° °[address] °[place], [place] °[phone] ° °[facility] (Outpatient/Inpatient, [facility])    °(insurance only) [phone]      °       °[facility] (Groups & Residential) °[place], [place] °[phone] ° °   °[facility]     °[address]     °[place], [place]      °[phone]      ° °[facility] (for caregivers and family) °[address] °[place], [place] °336-299-4655 ° ° ° ° ° °Residential Treatment Programs °[facility]      °[address], [place], [place] [address]  °[phone]      ° °[facility] °[address] [place], [place] [address] °[phone] ° °[facility]        °[phone]      ° °[facility] °[phone] ° °[facility]             °[address]                                         °[place], [place]                                                °[phone] or [phone]                              ° °[facility] [place] °[address] °[place], [address] °[phone] ° °[facility]    °[address]      °[place], [place]     °[phone]      ° °[facility] °[address] °[place], [place] °[phone] ° °[facility]   °[address]     °[place], [place] [address]     °[phone]      °Admissions: 8am-3pm M-F ° °[facility] °[address] °[place] °336-227-7417 ° °[facility]: Residential Program (90 Days)   °[place], [place]      °336-725-8389 or 800-758-6077    ° °[facility] °[place], [place] °(Walk in Hours over the weekend or by referral) ° °[facility] °[address], [place], [place] [address] °([phone] ° °[facility]: [facility]:  1-877-626-1772 (for crisis response 24 hours a day) °[facility] Hotline:      1-800-256-2452 °Outpatient Psychiatry and Counseling ° °[facility]: [place] Crisis   Management 24 hours:  1-877-626-1772 ° °[facility] sliding scale fee and walk in schedule: M-F 8am-12pm/1pm-3pm °[address]  °[place], [place] [address] °[phone] ° °[facility] °[address] °[place], [place] [address] °[phone] ° °[facility] ([facility] new patient walk-in appointments available Monday - Friday 8am -3pm.          °[address] °[place], [place] [address] °336-676-6840 or crisis line- 336-676-6905 ° °[facility] Intensive Outpatient Therapy Program °[address] °[place], [place] [address] °336-832-9804 ° °[facility]                  °Crisis Services      °[phone]      °[address]     °[place], [place] [address]                ° °[facility]   °[facility] °[phone] °[address] °[place], [place] [address] ° ° °[facility]          °[address]  °[place], [place] [address]       °[phone] ° °[facility] °[address] °[place], [place] [address] °336-292-1510 ° °[facility]    °[address]    °[place], [place] [address]     °[phone]      ° °Parish McKinney, MD     °[address]     °[place] [address]     °336-282-1251     °  °[facility] °[address] °[place] [address] ° °[facility]     °[address]     °[place], [place]      °[phone]      ° °[facility] °Alejandro Ahluwalia, MD °[address] °[place], [place] [address] °[phone] ° °[facility]     °[address]     °[place], [place] [address]     °336-274-1237      ° °[facility] °[address] °[place], [place] [address] °336-854-4450 °Resources for Temporary Residential Assistance/Crisis Centers ° °DAY CENTERS °[facility] °M-F 8am-3pm   °[address] [place], [place] [address]   336-332-0824 °Services include: laundry, barbering, support groups, case management, phone  & computer access, showers, AA/NA mtgs, mental health/substance abuse nurse, job skills class, disability information, VA assistance, spiritual classes, etc.  ° °HOMELESS SHELTERS ° °[facility]     °[facility]   °[address], [place]     °[phone]       °       °[facility] (women and children)       °[address] °[place], [place] [address] °336-275-0820 °[email or web address] for application and process °Application Required ° °[facility]   °[address]    °[place] [address]     °[phone]       °             °[facility] °[address] °,  [address] °[phone] °336-235-0363(schedule application appt.) °Application Required ° °[facility] (women only)    °[address]     °[place],  [address]     °336-884-1039      °  Intake starts 6pm daily °Need valid ID, SSC, & Police report °[facility] [place] °[address] °[place], [place] °336-881-5420 °Application Required ° °[facility] (men only)     °[address]      °[place], [place]     °[phone]      ° °[facility] °(Pregnant women only) °[address] °[place], [place] °336-275-0206 ° °[facility]      °[address]      °[place], [place] [address]     °[phone]      °       °[facility] °[address] °[place], [place] °336-723-1848 °90 day commitment/SA/Application process ° °[facility](men only)     °[address]     °[place], [place]     °[phone]       °Check-in at 7pm     °       °[facility] °[address] °[place], [place] [address] °336-248-6684 °Men/Women/Women and Children must be there by 7 pm ° °[facility] °[place], [place] °336-722-8721                ° °

## 2016-03-08 NOTE — ED Notes (Signed)
Bed: WA25 Expected date:  Expected time:  Means of arrival:  Comments: EMS OD 

## 2016-03-08 NOTE — ED Notes (Signed)
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 Still somewhat obtunded by answer's all questions AOx4wise. Diabetic cbg on scene 276.
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
## 2016-03-29 ENCOUNTER — Emergency Department (HOSPITAL_COMMUNITY)
Admission: EM | Admit: 2016-03-29 | Discharge: 2016-03-29 | Disposition: A | Discharge status: Home / Self Care | Payer: Medicaid Other | Attending: Emergency Medicine | Admitting: Emergency Medicine

## 2016-03-29 ENCOUNTER — Encounter (HOSPITAL_COMMUNITY): Payer: Self-pay

## 2016-03-29 ENCOUNTER — Emergency Department (HOSPITAL_COMMUNITY): Payer: Medicaid Other

## 2016-03-29 DIAGNOSIS — S0219XA Other fracture of base of skull, initial encounter for closed fracture: Secondary | ICD-10-CM | POA: Insufficient documentation

## 2016-03-29 DIAGNOSIS — S60812A Abrasion of left wrist, initial encounter: Secondary | ICD-10-CM | POA: Diagnosis not present

## 2016-03-29 DIAGNOSIS — F17210 Nicotine dependence, cigarettes, uncomplicated: Secondary | ICD-10-CM | POA: Insufficient documentation

## 2016-03-29 DIAGNOSIS — S60416A Abrasion of right little finger, initial encounter: Secondary | ICD-10-CM | POA: Diagnosis not present

## 2016-03-29 DIAGNOSIS — S0231XA Fracture of orbital floor, right side, initial encounter for closed fracture: Secondary | ICD-10-CM | POA: Diagnosis not present

## 2016-03-29 DIAGNOSIS — Z794 Long term (current) use of insulin: Secondary | ICD-10-CM | POA: Diagnosis not present

## 2016-03-29 DIAGNOSIS — E119 Type 2 diabetes mellitus without complications: Secondary | ICD-10-CM | POA: Insufficient documentation

## 2016-03-29 DIAGNOSIS — S0285XA Fracture of orbit, unspecified, initial encounter for closed fracture: Secondary | ICD-10-CM

## 2016-03-29 DIAGNOSIS — S0990XA Unspecified injury of head, initial encounter: Secondary | ICD-10-CM | POA: Diagnosis present

## 2016-03-29 DIAGNOSIS — S0101XA Laceration without foreign body of scalp, initial encounter: Secondary | ICD-10-CM

## 2016-03-29 DIAGNOSIS — Y939 Activity, unspecified: Secondary | ICD-10-CM | POA: Insufficient documentation

## 2016-03-29 DIAGNOSIS — Y929 Unspecified place or not applicable: Secondary | ICD-10-CM | POA: Diagnosis not present

## 2016-03-29 DIAGNOSIS — Z79899 Other long term (current) drug therapy: Secondary | ICD-10-CM | POA: Diagnosis not present

## 2016-03-29 DIAGNOSIS — Y999 Unspecified external cause status: Secondary | ICD-10-CM | POA: Insufficient documentation

## 2016-03-29 DIAGNOSIS — S0102XA Laceration with foreign body of scalp, initial encounter: Secondary | ICD-10-CM | POA: Insufficient documentation

## 2016-03-29 DIAGNOSIS — T07XXXA Unspecified multiple injuries, initial encounter: Secondary | ICD-10-CM

## 2016-03-29 LAB — CBG MONITORING, ED: GLUCOSE-CAPILLARY: 180 mg/dL — AB (ref 65–99)

## 2016-03-29 MED ORDER — FENTANYL CITRATE (PF) 100 MCG/2ML IJ SOLN
50.0000 ug | Freq: Once | INTRAMUSCULAR | Status: AC
  Administered 2016-03-29: 50 ug via INTRAVENOUS
  Filled 2016-03-29: qty 2

## 2016-03-29 MED ORDER — OXYCODONE-ACETAMINOPHEN 5-325 MG PO TABS: 1.0000 | ORAL_TABLET | Freq: Four times a day (QID) | ORAL | Status: AC | PRN

## 2016-03-29 MED ORDER — TETANUS-DIPHTH-ACELL PERTUSSIS 5-2.5-18.5 LF-MCG/0.5 IM SUSP: 0.5000 mL | Freq: Once | INTRAMUSCULAR | Status: DC

## 2016-03-29 MED ORDER — OXYCODONE-ACETAMINOPHEN 5-325 MG PO TABS
2.0000 | ORAL_TABLET | Freq: Once | ORAL | Status: AC
  Administered 2016-03-29: 2 via ORAL
  Filled 2016-03-29: qty 2

## 2016-03-29 NOTE — ED Notes (Signed)
Patient transported to CT 
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
## 2016-03-29 NOTE — Discharge Instructions (Signed)
No nose blowing. Follow-up with ENT and neurosurgery. Have the sutures taken out in 7-10 days.  Head Injury, Adult You have received a head injury. It does not appear serious at this time. Headaches and vomiting are common following head injury. It should be easy to awaken from sleeping. Sometimes it is necessary for you to stay in the emergency department for a while for observation. Sometimes admission to the hospital may be needed. After injuries such as yours, most problems occur within the first 24 hours, but side effects may occur up to 7-10 days after the injury. It is important for you to carefully monitor your condition and contact your health care provider or seek immediate medical care if there is a change in your condition. WHAT ARE THE TYPES OF HEAD INJURIES? Head injuries can be as minor as a bump. Some head injuries can be more severe. More severe head injuries include:  A jarring injury to the brain (concussion).  A bruise of the brain (contusion). This mean there is bleeding in the brain that can cause swelling.  A cracked skull (skull fracture).  Bleeding in the brain that collects, clots, and forms a bump (hematoma). WHAT CAUSES A HEAD INJURY? A serious head injury is most likely to happen to someone who is in a car wreck and is not wearing a seat belt. Other causes of major head injuries include bicycle or motorcycle accidents, sports injuries, and falls. HOW ARE HEAD INJURIES DIAGNOSED? A complete history of the event leading to the injury and your current symptoms will be helpful in diagnosing head injuries. Many times, pictures of the brain, such as CT or MRI are needed to see the extent of the injury. Often, an overnight hospital stay is necessary for observation.  WHEN SHOULD I SEEK IMMEDIATE MEDICAL CARE?  You should get help right away if:  You have confusion or drowsiness.  You feel sick to your stomach (nauseous) or have continued, forceful vomiting.  You have  dizziness or unsteadiness that is getting worse.  You have severe, continued headaches not relieved by medicine. Only take over-the-counter or prescription medicines for pain, fever, or discomfort as directed by your health care provider.  You do not have normal function of the arms or legs or are unable to walk.  You notice changes in the black spots in the center of the colored part of your eye (pupil).  You have a clear or bloody fluid coming from your nose or ears.  You have a loss of vision. During the next 24 hours after the injury, you must stay with someone who can watch you for the warning signs. This person should contact local emergency services (911 in the [place] if you have seizures, you become unconscious, or you are unable to wake up. HOW CAN I PREVENT A HEAD INJURY IN THE FUTURE? The most important factor for preventing major head injuries is avoiding motor vehicle accidents. To minimize the potential for damage to your head, it is crucial to wear seat belts while riding in motor vehicles. Wearing helmets while bike riding and playing collision sports (like football) is also helpful. Also, avoiding dangerous activities around the house will further help reduce your risk of head injury.  WHEN CAN I RETURN TO NORMAL ACTIVITIES AND ATHLETICS? You should be reevaluated by your health care provider before returning to these activities. If you have any of the following symptoms, you should not return to activities or contact sports until 1 week after the  symptoms have stopped:  Persistent headache.  Dizziness or vertigo.  Poor attention and concentration.  Confusion.  Memory problems.  Nausea or vomiting.  Fatigue or tire easily.  Irritability.  Intolerant of bright lights or loud noises.  Anxiety or depression.  Disturbed sleep. MAKE SURE YOU:   Understand these instructions.  Will watch your condition.  Will get help right away if you are not doing well or get  worse.   This information is not intended to replace advice given to you by your health care provider. Make sure you discuss any questions you have with your health care provider.   Document Released: 10/03/2005 Document Revised: 10/24/2014 Document Reviewed: 06/10/2013 Elsevier Interactive Patient Education 2016 [employer] Inc[employer]  Laceration Care, Adult A laceration is a cut that goes through all of the layers of the skin and into the tissue that is right under the skin. Some lacerations heal on their own. Others need to be closed with stitches (sutures), staples, skin adhesive strips, or skin glue. Proper laceration care minimizes the risk of infection and helps the laceration to heal better. HOW TO CARE FOR YOUR LACERATION If sutures or staples were used:  Keep the wound clean and dry.  If you were given a bandage (dressing), you should change it at least one time per day or as told by your health care provider. You should also change it if it becomes wet or dirty.  Keep the wound completely dry for the first 24 hours or as told by your health care provider. After that time, you may shower or bathe. However, make sure that the wound is not soaked in water until after the sutures or staples have been removed.  Clean the wound one time each day or as told by your health care provider:  Wash the wound with soap and water.  Rinse the wound with water to remove all soap.  Pat the wound dry with a clean towel. Do not rub the wound.  After cleaning the wound, apply a thin layer of antibiotic ointmentas told by your health care provider. This will help to prevent infection and keep the dressing from sticking to the wound.  Have the sutures or staples removed as told by your health care provider. If skin adhesive strips were used:  Keep the wound clean and dry.  If you were given a bandage (dressing), you should change it at least one time per day or as told by your health care provider. You  should also change it if it becomes dirty or wet.  Do not get the skin adhesive strips wet. You may shower or bathe, but be careful to keep the wound dry.  If the wound gets wet, pat it dry with a clean towel. Do not rub the wound.  Skin adhesive strips fall off on their own. You may trim the strips as the wound heals. Do not remove skin adhesive strips that are still stuck to the wound. They will fall off in time. If skin glue was used:  Try to keep the wound dry, but you may briefly wet it in the shower or bath. Do not soak the wound in water, such as by swimming.  After you have showered or bathed, gently pat the wound dry with a clean towel. Do not rub the wound.  Do not do any activities that will make you sweat heavily until the skin glue has fallen off on its own.  Do not apply liquid, cream, or ointment  medicine to the wound while the skin glue is in place. Using those may loosen the film before the wound has healed.  If you were given a bandage (dressing), you should change it at least one time per day or as told by your health care provider. You should also change it if it becomes dirty or wet.  If a dressing is placed over the wound, be careful not to apply tape directly over the skin glue. Doing that may cause the glue to be pulled off before the wound has healed.  Do not pick at the glue. The skin glue usually remains in place for 5-10 days, then it falls off of the skin. General Instructions  Take over-the-counter and prescription medicines only as told by your health care provider.  If you were prescribed an antibiotic medicine or ointment, take or apply it as told by your doctor. Do not stop using it even if your condition improves.  To help prevent scarring, make sure to cover your wound with sunscreen whenever you are outside after stitches are removed, after adhesive strips are removed, or when glue remains in place and the wound is healed. Make sure to wear a sunscreen  of at least 30 SPF.  Do not scratch or pick at the wound.  Keep all follow-up visits as told by your health care provider. This is important.  Check your wound every day for signs of infection. Watch for:  Redness, swelling, or pain.  Fluid, blood, or pus.  Raise (elevate) the injured area above the level of your heart while you are sitting or lying down, if possible. SEEK MEDICAL CARE IF:  You received a tetanus shot and you have swelling, severe pain, redness, or bleeding at the injection site.  You have a fever.  A wound that was closed breaks open.  You notice a bad smell coming from your wound or your dressing.  You notice something coming out of the wound, such as wood or glass.  Your pain is not controlled with medicine.  You have increased redness, swelling, or pain at the site of your wound.  You have fluid, blood, or pus coming from your wound.  You notice a change in the color of your skin near your wound.  You need to change the dressing frequently due to fluid, blood, or pus draining from the wound.  You develop a new rash.  You develop numbness around the wound. SEEK IMMEDIATE MEDICAL CARE IF:  You develop severe swelling around the wound.  Your pain suddenly increases and is severe.  You develop painful lumps near the wound or on skin that is anywhere on your body.  You have a red streak going away from your wound.  The wound is on your hand or foot and you cannot properly move a finger or toe.  The wound is on your hand or foot and you notice that your fingers or toes look pale or bluish.   This information is not intended to replace advice given to you by your health care provider. Make sure you discuss any questions you have with your health care provider.   Document Released: 10/03/2005 Document Revised: 02/17/2015 Document Reviewed: 09/29/2014 Elsevier Interactive Patient Education 2016 [employer]  Stitches, BalmStaples, or Adhesive Wound  Closure Health care providers use stitches (sutures), staples, and certain glue (skin adhesives) to hold skin together while it heals (wound closure). You may need this treatment after you have surgery or if you cut your  skin accidentally. These methods help your skin to heal more quickly and make it less likely that you will have a scar. A wound may take several months to heal completely. The type of wound you have determines when your wound gets closed. In most cases, the wound is closed as soon as possible (primary skin closure). Sometimes, closure is delayed so the wound can be cleaned and allowed to heal naturally. This reduces the chance of infection. Delayed closure may be needed if your wound:  Is caused by a bite.  Happened more than 6 hours ago.  Involves loss of skin or the tissues under the skin.  Has dirt or debris in it that cannot be removed.  Is infected. WHAT ARE THE DIFFERENT KINDS OF WOUND CLOSURES? There are many options for wound closure. The one that your health care provider uses depends on how deep and how large your wound is. Adhesive Glue To use this type of glue to close a wound, your health care provider holds the edges of the wound together and paints the glue on the surface of your skin. You may need more than one layer of glue. Then the wound may be covered with a light bandage (dressing). This type of skin closure may be used for small wounds that are not deep (superficial). Using glue for wound closure is less painful than other methods. It does not require a medicine that numbs the area (local anesthetic). This method also leaves nothing to be removed. Adhesive glue is often used for children and on facial wounds. Adhesive glue cannot be used for wounds that are deep, uneven, or bleeding. It is not used inside of a wound.  Adhesive Strips These strips are made of sticky (adhesive), porous paper. They are applied across your skin edges like a regular adhesive  bandage. You leave them on until they fall off. Adhesive strips may be used to close very superficial wounds. They may also be used along with sutures to improve the closure of your skin edges.  Sutures Sutures are the oldest method of wound closure. Sutures can be made from natural substances, such as silk, or from synthetic materials, such as nylon and steel. They can be made from a material that your body can break down as your wound heals (absorbable), or they can be made from a material that needs to be removed from your skin (nonabsorbable). They come in many different strengths and sizes. Your health care provider attaches the sutures to a steel needle on one end. Sutures can be passed through your skin, or through the tissues beneath your skin. Then they are tied and cut. Your skin edges may be closed in one continuous stitch or in separate stitches. Sutures are strong and can be used for all kinds of wounds. Absorbable sutures may be used to close tissues under the skin. The disadvantage of sutures is that they may cause skin reactions that lead to infection. Nonabsorbable sutures need to be removed. Staples When surgical staples are used to close a wound, the edges of your skin on both sides of the wound are brought close together. A staple is placed across the wound, and an instrument secures the edges together. Staples are often used to close surgical cuts (incisions). Staples are faster to use than sutures, and they cause less skin reaction. Staples need to be removed using a tool that bends the staples away from your skin. HOW DO I CARE FOR MY WOUND CLOSURE?  Take medicines  only as directed by your health care provider.  If you were prescribed an antibiotic medicine for your wound, finish it all even if you start to feel better.  Use ointments or creams only as directed by your health care provider.  Wash your hands with soap and water before and after touching your wound.  Do not  soak your wound in water. Do not take baths, swim, or use a hot tub until your health care provider approves.  Ask your health care provider when you can start showering. Cover your wound if directed by your health care provider.  Do not take out your own sutures or staples.  Do not pick at your wound. Picking can cause an infection.  Keep all follow-up visits as directed by your health care provider. This is important. HOW LONG WILL I HAVE MY WOUND CLOSURE?  Leave adhesive glue on your skin until the glue peels away.  Leave adhesive strips on your skin until the strips fall off.  Absorbable sutures will dissolve within several days.  Nonabsorbable sutures and staples must be removed. The location of the wound will determine how long they stay in. This can range from several days to a couple of weeks. WHEN SHOULD I SEEK HELP FOR MY WOUND CLOSURE? Contact your health care provider if:  You have a fever.  You have chills.  You have drainage, redness, swelling, or pain at your wound.  There is a bad smell coming from your wound.  The skin edges of your wound start to separate after your sutures have been removed.  Your wound becomes thick, raised, and darker in color after your sutures come out (scarring).   This information is not intended to replace advice given to you by your health care provider. Make sure you discuss any questions you have with your health care provider.   Document Released: 06/28/2001 Document Revised: 10/24/2014 Document Reviewed: 03/12/2014 Elsevier Interactive Patient Education [employer]
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
## 2016-03-29 NOTE — ED Notes (Signed)
While discharging pt he attempted to get in wheelchair and states he is unable to bare weight on left leg. Dr. Rubin PayorPickering made aware and xray ordered

## 2016-03-29 NOTE — ED Notes (Signed)
CBG 180 

## 2016-04-26 ENCOUNTER — Ambulatory Visit (HOSPITAL_COMMUNITY)
Admission: EM | Admit: 2016-04-26 | Discharge: 2016-04-26 | Disposition: A | Discharge status: Home / Self Care | Payer: Medicaid Other | Attending: Family Medicine | Admitting: Family Medicine

## 2016-04-26 ENCOUNTER — Encounter (HOSPITAL_COMMUNITY): Payer: Self-pay | Admitting: Emergency Medicine

## 2016-04-26 DIAGNOSIS — Z760 Encounter for issue of repeat prescription: Secondary | ICD-10-CM

## 2016-04-26 DIAGNOSIS — E119 Type 2 diabetes mellitus without complications: Secondary | ICD-10-CM

## 2016-04-26 HISTORY — DX: Type 2 diabetes mellitus without complications: E11.9

## 2016-04-26 HISTORY — DX: Other specified postprocedural states: Z98.890

## 2016-04-26 MED ORDER — INSULIN ASPART 100 UNIT/ML FLEXPEN: 10.0000 [IU] | PEN_INJECTOR | Freq: Three times a day (TID) | SUBCUTANEOUS | Status: AC

## 2016-04-26 MED ORDER — GLUCOSE BLOOD VI STRP: ORAL_STRIP | Status: AC

## 2016-04-26 MED ORDER — ACCU-CHEK SAFE-T PRO LANCETS MISC: Status: AC

## 2016-04-26 MED ORDER — INSULIN GLARGINE 100 UNIT/ML ~~LOC~~ SOLN: 40.0000 [IU] | Freq: Every day | SUBCUTANEOUS | Status: AC

## 2016-04-26 NOTE — ED Provider Notes (Signed)
CSN: [id number]     Arrival date & time 04/26/16  1456 History   First MD Initiated Contact with Patient 04/26/16 1547     Chief Complaint  Patient presents with  . Medication Refill   (Consider location/radiation/quality/duration/timing/severity/associated sxs/prior Treatment) HPI History obtained from patient:  Pt presents with the cc of:  Medication refill Duration of symptoms: States he ran out of his medicines today Treatment prior to arrival: None Context: Patient states he just recently relocated from [place] to [place] has not established with a primary care provider here yet and is out of his medications. States that he is essentially homeless at this time. Other symptoms include: None Pain score: 6 FAMILY HISTORY: Diabetes    Past Medical History  Diagnosis Date  . History of orthopedic surgery   . Diabetes mellitus without complication [facility])    Past Surgical History  Procedure Laterality Date  . Orthopedic surgery     History reviewed. No pertinent family history. Social History  Substance Use Topics  . Smoking status: Never Smoker   . Smokeless tobacco: None  . Alcohol Use: No    Review of Systems  Denies: HEADACHE, NAUSEA, ABDOMINAL PAIN, CHEST PAIN, CONGESTION, DYSURIA, SHORTNESS OF BREATH  Allergies  Review of patient's allergies indicates no known allergies.  Home Medications   Prior to Admission medications   Medication Sig Start Date End Date Taking? Authorizing Provider  insulin aspart (NOVOLOG FLEXPEN) 100 UNIT/ML FlexPen Inject into the skin 3 (three) times daily with meals.   Yes Historical Provider, MD  glucose blood test strip Use as instructed 04/26/16   Tharon Aquas, PA  insulin aspart (NOVOLOG FLEXPEN) 100 UNIT/ML FlexPen Inject 10 Units into the skin 3 (three) times daily with meals. 04/26/16   Tharon Aquas, PA  insulin glargine (LANTUS) 100 UNIT/ML injection Inject 0.4 mLs (40 Units total) into the skin at bedtime. 04/26/16    Tharon Aquas, PA  Lancets (ACCU-CHEK SAFE-T PRO) lancets Use as instructed 04/26/16   Tharon Aquas, PA   Meds Ordered and Administered this Visit  Medications - No data to display  BP 107/71 mmHg  Pulse 90  Temp(Src) 98.5 F (36.9 C) (Oral)  Resp 18  SpO2 97% No data found.   Physical Exam NURSES NOTES AND VITAL SIGNS REVIEWED. CONSTITUTIONAL: Well developed, well nourished, no acute distress HEENT: normocephalic, atraumatic EYES: Conjunctiva normal NECK:normal ROM, supple, no adenopathy PULMONARY:No respiratory distress, normal effort ABDOMINAL: Soft, ND, NT BS+, No CVAT MUSCULOSKELETAL: Normal ROM of all extremities,  SKIN: warm and dry without rash PSYCHIATRIC: Mood and affect, behavior are normal  ED Course  Procedures (including critical care time)  Labs Review Labs Reviewed - No data to display  Imaging Review No results found.   Visual Acuity Review  Right Eye Distance:   Left Eye Distance:   Bilateral Distance:    Right Eye Near:   Left Eye Near:    Bilateral Near:     Refills of his needed medications are done. I did not REFILLoxycodone rx.  I have also advised patient that he continues to call [phone] number to call and help find a doctor he has also been referred to community wellness. Review of the [place] control substance   reporting system is significant the patient received oxycodone June 14. Prior to that he had been receiving regular monthly doses for [facility] in [place]. Unable to speak to the physician that prescribes his medications.  MDM   1. Medication refill     Patient is reassured that there are no issues that require transfer to higher level of care at this time or additional tests. Patient is advised to continue home symptomatic treatment. Patient is advised that if there are new or worsening symptoms to attend the emergency department, contact primary care provider, or return to UC. Instructions of  care provided discharged home in stable condition.    THIS NOTE WAS GENERATED USING A VOICE RECOGNITION SOFTWARE PROGRAM. ALL REASONABLE EFFORTS  WERE MADE TO PROOFREAD THIS DOCUMENT FOR ACCURACY.  I have verbally reviewed the discharge instructions with the patient. A printed AVS was given to the patient.  All questions were answered prior to discharge.      Tharon AquasFrank C Patrick, PA 04/26/16 1712

## 2016-04-26 NOTE — ED Notes (Signed)
[facility] a [facility] of needing his diabetes medication ref[facility]. The patient did not have a list of the medicines and none were in EPIC. The patient was only able to provide NOVO LOG 100 units/mL Flex PEN.

## 2016-04-26 NOTE — Discharge Instructions (Signed)
Medicine Refill at the Emergency Department  We have refilled your medicine today, but it is best for you to get refills through your primary health care provider's office. In the future, please plan ahead so you do not need to get refills from the emergency department.  If the medicine we refilled was a maintenance medicine, you may have received only enough to get you by until you are able to see your regular health care provider.     This information is not intended to replace advice given to you by your health care provider. Make sure you discuss any questions you have with your health care provider.     Document Released: 01/20/2004 Document Revised: 10/24/2014 Document Reviewed: 01/10/2014  Elsevier Interactive Patient Education 2016 Elsevier Inc.

## 2016-09-07 NOTE — Telephone Encounter (Signed)
ERROR

## 2016-10-25 ENCOUNTER — Encounter (HOSPITAL_COMMUNITY): Payer: Self-pay

## 2016-10-25 ENCOUNTER — Emergency Department (HOSPITAL_COMMUNITY)
Admission: EM | Admit: 2016-10-25 | Discharge: 2016-10-25 | Discharge status: Against Medical Advice | Payer: Medicaid Other | Attending: Emergency Medicine | Admitting: Emergency Medicine

## 2016-10-25 DIAGNOSIS — E119 Type 2 diabetes mellitus without complications: Secondary | ICD-10-CM | POA: Insufficient documentation

## 2016-10-25 DIAGNOSIS — Z794 Long term (current) use of insulin: Secondary | ICD-10-CM | POA: Insufficient documentation

## 2016-10-25 DIAGNOSIS — R9431 Abnormal electrocardiogram [ECG] [EKG]: Secondary | ICD-10-CM | POA: Insufficient documentation

## 2016-10-25 DIAGNOSIS — R0681 Apnea, not elsewhere classified: Secondary | ICD-10-CM | POA: Insufficient documentation

## 2016-10-25 DIAGNOSIS — T50901A Poisoning by unspecified drugs, medicaments and biological substances, accidental (unintentional), initial encounter: Secondary | ICD-10-CM | POA: Insufficient documentation
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
## 2016-10-25 NOTE — ED Notes (Signed)
Pt left AMA. Encouraged patient to return if any chest pain or shortness of breath. Pt denied any of these symptoms and voiced understanding. No acute distress noted, pt ambulatory with no difficulty. EDP aware.

## 2016-10-25 NOTE — ED Notes (Signed)
Pt asked for water. Informed Brooke - Charity fundraiserN.

## 2016-10-25 NOTE — ED Provider Notes (Signed)
[facility]EM[facility]ENCY DEPT Provider Note   CSN: [id number] Arrival date & time: 10/25/16  1200     History   Chief Complaint Chief Complaint  Patient presents with  . Drug Overdose  . Abnormal ECG    HPI Charlotta NewtonRonald Lapaglia is a 51 y.o. male.  HPI   51 year old male presents status post drug overdose. Patient reports snorting some "bad dope". He went apneic, EMS was called who gave Narcan. Patient became alert and oriented and had no acute complaints. Patient was brought to the ED for further evaluation due to concerns over bundle branch block. Vision reports he used to inject drugs, now only snorts it. He denies any specific complaints, reports he is a diabetic on insulin, taking NovoLog 20 units 3 times daily.  Past Medical History:  Diagnosis Date  . Diabetes mellitus without complication (HCC)   . History of orthopedic surgery     There are no active problems to display for this patient.   Past Surgical History:  Procedure Laterality Date  . ORTHOPEDIC SURGERY         Home Medications    Prior to Admission medications   Medication Sig Start Date End Date Taking? Authorizing Provider  glucose blood test strip Use as instructed 04/26/16   Tharon AquasFrank C Patrick, PA  insulin aspart (NOVOLOG FLEXPEN) 100 UNIT/ML FlexPen Inject into the skin 3 (three) times daily with meals.    Historical Provider, MD  insulin aspart (NOVOLOG FLEXPEN) 100 UNIT/ML FlexPen Inject 10 Units into the skin 3 (three) times daily with meals. 04/26/16   Tharon AquasFrank C Patrick, PA  insulin glargine (LANTUS) 100 UNIT/ML injection Inject 0.4 mLs (40 Units total) into the skin at bedtime. 04/26/16   Tharon AquasFrank C Patrick, PA  Lancets (ACCU-CHEK SAFE-T PRO) lancets Use as instructed 04/26/16   Tharon AquasFrank C Patrick, PA    Family History History reviewed. No pertinent family history.  Social History Social History  Substance Use Topics  . Smoking status: Never Smoker  . Smokeless tobacco: Never Used  . Alcohol use No      Allergies   Patient has no known allergies.   Review of Systems Review of Systems  All other systems reviewed and are negative.    Physical Exam Updated Vital Signs BP 131/87 (BP Location: Right Arm)   Pulse 87   Temp 97.9 F (36.6 C) (Oral)   Resp 16   SpO2 95%   Physical Exam  Constitutional: He is oriented to person, place, and time. He appears well-developed and well-nourished.  HENT:  Head: Normocephalic and atraumatic.  Eyes: Conjunctivae are normal. Pupils are equal, round, and reactive to light. Right eye exhibits no discharge. Left eye exhibits no discharge. No scleral icterus.  Neck: Normal range of motion. No JVD present. No tracheal deviation present.  Cardiovascular: Normal rate, regular rhythm, normal heart sounds and intact distal pulses.   No murmur heard. Pulmonary/Chest: Effort normal. No stridor. No respiratory distress. He has no wheezes. He has no rales.  Neurological: He is alert and oriented to person, place, and time. Coordination normal.  Psychiatric: He has a normal mood and affect. His behavior is normal. Judgment and thought content normal.  Nursing note and vitals reviewed.   ED Treatments / Results  Labs (all labs ordered are listed, but only abnormal results are displayed) Labs Reviewed - No data to display  EKG  EKG Interpretation  Date/Time:  Tuesday October 25 2016 12:11:48 EST Ventricular Rate:  90 PR Interval:  QRS Duration: 131 QT Interval:  399 QTC Calculation: 489 R Axis:   96 Text Interpretation:  Sinus rhythm Anterior infarct, old Non-specific intra-ventricular conduction delay Non-specific ST-t changes No old tracing to compare Confirmed by [facility]  MD, ELLIOTT [id number]) on 10/25/2016 12:49:31 PM       Radiology No results found.  Procedures Procedures (including critical care time)  Medications Ordered in ED Medications - No data to display   Initial Impression / Assessment and Plan / ED Course  I have  reviewed the triage vital signs and the nursing notes.  Pertinent labs & imaging results that were available during my care of the patient were reviewed by me and considered in my medical decision making (see chart for details).  Clinical Course     Final Clinical Impressions(s) / ED Diagnoses   Final diagnoses:  Accidental drug overdose, initial encounter   Labs:  Imaging:  Consults:  Therapeutics:  Discharge Meds:   Assessment/Plan:  51 year old male presents after drug overdose. Asian alert and oriented time of evaluation oh complaints. Patient will be monitored here in the ED for rebound symptoms. Prior to my reassessment patient left AGAINST MEDICAL ADVICE.    New Prescriptions Discharge Medication List as of 10/25/2016  2:09 PM       Eyvonne Mechanic, PA-C 10/25/16 2003    Mancel Bale, MD 10/26/16 [phone]

## 2016-10-25 NOTE — ED Notes (Signed)
Gave pt ice water, per Jeff - PA. 

## 2016-11-24 ENCOUNTER — Emergency Department (HOSPITAL_COMMUNITY)
Admission: EM | Admit: 2016-11-24 | Discharge: 2016-12-15 | Disposition: E | Payer: Medicaid Other | Attending: Emergency Medicine | Admitting: Emergency Medicine

## 2016-11-24 DIAGNOSIS — Y999 Unspecified external cause status: Secondary | ICD-10-CM | POA: Diagnosis not present

## 2016-11-24 DIAGNOSIS — W3400XA Accidental discharge from unspecified firearms or gun, initial encounter: Secondary | ICD-10-CM

## 2016-11-24 DIAGNOSIS — S21302A Unspecified open wound of left front wall of thorax with penetration into thoracic cavity, initial encounter: Secondary | ICD-10-CM | POA: Diagnosis present

## 2016-11-24 DIAGNOSIS — Y929 Unspecified place or not applicable: Secondary | ICD-10-CM | POA: Insufficient documentation

## 2016-11-24 DIAGNOSIS — E119 Type 2 diabetes mellitus without complications: Secondary | ICD-10-CM | POA: Insufficient documentation

## 2016-11-24 DIAGNOSIS — S21332A Puncture wound without foreign body of left front wall of thorax with penetration into thoracic cavity, initial encounter: Secondary | ICD-10-CM

## 2016-11-24 DIAGNOSIS — Y939 Activity, unspecified: Secondary | ICD-10-CM | POA: Insufficient documentation

## 2016-11-25 LAB — PREPARE FRESH FROZEN PLASMA
UNIT DIVISION: 0
Unit division: 0

## 2016-12-15 NOTE — ED Notes (Signed)
Patient time of death occurred at 2359 on 12/08/2016
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
## 2016-12-15 NOTE — ED Notes (Signed)
Organ donation notified on 12/08/2016 at 0038 talk to a back up Center name on staff Vincent BanningKeneth, retention for donation. Organ donation will call back organ donation reference number [id number]. No next of kind available at time of organ donation.

## 2016-12-15 DEATH — deceased
